# Patient Record
Sex: Male | Born: 1955 | Race: White | Hispanic: No | Marital: Married | Smoking: Never smoker
Health system: Southern US, Community
[De-identification: ages and names within clinical notes are randomized; demographics above are authoritative.]

## PROBLEM LIST (undated history)

## (undated) DIAGNOSIS — G20 Parkinson's disease: Secondary | ICD-10-CM

## (undated) HISTORY — PX: HERNIA REPAIR: SHX51

## (undated) HISTORY — PX: CYST REMOVAL NECK: SHX6281

---

## 2007-05-26 ENCOUNTER — Ambulatory Visit: Payer: Self-pay | Admitting: Internal Medicine

## 2007-08-05 ENCOUNTER — Ambulatory Visit: Payer: Self-pay | Admitting: Gastroenterology

## 2007-08-25 ENCOUNTER — Ambulatory Visit: Payer: Self-pay | Admitting: General Surgery

## 2011-03-08 ENCOUNTER — Encounter: Payer: Self-pay | Admitting: Nurse Practitioner

## 2012-02-03 ENCOUNTER — Ambulatory Visit: Payer: Self-pay | Admitting: Gastroenterology

## 2012-03-31 ENCOUNTER — Ambulatory Visit: Payer: Self-pay | Admitting: Gastroenterology

## 2013-03-02 ENCOUNTER — Ambulatory Visit: Payer: Self-pay | Admitting: Family Medicine

## 2016-10-04 ENCOUNTER — Other Ambulatory Visit: Payer: Self-pay | Admitting: Neurology

## 2016-10-04 DIAGNOSIS — S0990XA Unspecified injury of head, initial encounter: Secondary | ICD-10-CM

## 2016-10-11 ENCOUNTER — Ambulatory Visit
Admission: RE | Admit: 2016-10-11 | Discharge: 2016-10-11 | Disposition: A | Discharge status: Home / Self Care | Payer: 59 | Source: Ambulatory Visit | Attending: Neurology | Admitting: Neurology

## 2016-10-11 DIAGNOSIS — G20 Parkinson's disease: Secondary | ICD-10-CM | POA: Diagnosis present

## 2016-10-11 DIAGNOSIS — I6782 Cerebral ischemia: Secondary | ICD-10-CM | POA: Diagnosis not present

## 2016-10-11 DIAGNOSIS — G319 Degenerative disease of nervous system, unspecified: Secondary | ICD-10-CM | POA: Diagnosis not present

## 2016-10-11 DIAGNOSIS — S0990XA Unspecified injury of head, initial encounter: Secondary | ICD-10-CM

## 2017-10-31 ENCOUNTER — Ambulatory Visit: Admission: RE | Admit: 2017-10-31 | Payer: 59 | Source: Ambulatory Visit | Admitting: Gastroenterology

## 2017-10-31 ENCOUNTER — Encounter: Admission: RE | Payer: Self-pay | Source: Ambulatory Visit

## 2017-10-31 SURGERY — COLONOSCOPY WITH PROPOFOL
Anesthesia: General

## 2018-01-07 ENCOUNTER — Other Ambulatory Visit: Payer: Self-pay | Admitting: Nurse Practitioner

## 2018-01-07 DIAGNOSIS — F99 Mental disorder, not otherwise specified: Secondary | ICD-10-CM

## 2018-01-16 ENCOUNTER — Ambulatory Visit
Admission: RE | Admit: 2018-01-16 | Discharge: 2018-01-16 | Disposition: A | Discharge status: Home / Self Care | Payer: 59 | Source: Ambulatory Visit | Attending: Nurse Practitioner | Admitting: Nurse Practitioner

## 2018-01-16 DIAGNOSIS — F99 Mental disorder, not otherwise specified: Secondary | ICD-10-CM | POA: Insufficient documentation

## 2018-01-16 DIAGNOSIS — I6782 Cerebral ischemia: Secondary | ICD-10-CM | POA: Insufficient documentation

## 2018-01-16 DIAGNOSIS — G319 Degenerative disease of nervous system, unspecified: Secondary | ICD-10-CM | POA: Diagnosis not present

## 2018-01-16 DIAGNOSIS — R296 Repeated falls: Secondary | ICD-10-CM | POA: Insufficient documentation

## 2018-01-16 DIAGNOSIS — R2689 Other abnormalities of gait and mobility: Secondary | ICD-10-CM | POA: Insufficient documentation

## 2018-01-16 LAB — POCT I-STAT CREATININE: CREATININE: 0.9 mg/dL (ref 0.61–1.24)

## 2018-01-16 MED ORDER — GADOBENATE DIMEGLUMINE 529 MG/ML IV SOLN
15.0000 mL | Freq: Once | INTRAVENOUS | Status: AC | PRN
  Administered 2018-01-16: 15 mL via INTRAVENOUS

## 2018-01-27 ENCOUNTER — Emergency Department: Payer: 59

## 2018-01-27 ENCOUNTER — Encounter: Payer: Self-pay | Admitting: Emergency Medicine

## 2018-01-27 ENCOUNTER — Emergency Department
Admission: EM | Admit: 2018-01-27 | Discharge: 2018-01-27 | Disposition: A | Discharge status: Home / Self Care | Payer: 59 | Attending: Emergency Medicine | Admitting: Emergency Medicine

## 2018-01-27 DIAGNOSIS — W01190A Fall on same level from slipping, tripping and stumbling with subsequent striking against furniture, initial encounter: Secondary | ICD-10-CM | POA: Diagnosis not present

## 2018-01-27 DIAGNOSIS — Z23 Encounter for immunization: Secondary | ICD-10-CM | POA: Insufficient documentation

## 2018-01-27 DIAGNOSIS — Y9389 Activity, other specified: Secondary | ICD-10-CM | POA: Diagnosis not present

## 2018-01-27 DIAGNOSIS — Y92009 Unspecified place in unspecified non-institutional (private) residence as the place of occurrence of the external cause: Secondary | ICD-10-CM

## 2018-01-27 DIAGNOSIS — W19XXXA Unspecified fall, initial encounter: Secondary | ICD-10-CM

## 2018-01-27 DIAGNOSIS — Y999 Unspecified external cause status: Secondary | ICD-10-CM | POA: Diagnosis not present

## 2018-01-27 DIAGNOSIS — S0990XA Unspecified injury of head, initial encounter: Secondary | ICD-10-CM | POA: Diagnosis present

## 2018-01-27 DIAGNOSIS — G20 Parkinson's disease: Secondary | ICD-10-CM | POA: Insufficient documentation

## 2018-01-27 DIAGNOSIS — R0781 Pleurodynia: Secondary | ICD-10-CM

## 2018-01-27 DIAGNOSIS — S0181XA Laceration without foreign body of other part of head, initial encounter: Secondary | ICD-10-CM | POA: Diagnosis not present

## 2018-01-27 DIAGNOSIS — Y92010 Kitchen of single-family (private) house as the place of occurrence of the external cause: Secondary | ICD-10-CM | POA: Diagnosis not present

## 2018-01-27 HISTORY — DX: Parkinson's disease: G20

## 2018-01-27 MED ORDER — LIDOCAINE-EPINEPHRINE 2 %-1:100000 IJ SOLN: 30.0000 mL | Freq: Once | INTRAMUSCULAR | Status: DC

## 2018-01-27 MED ORDER — LIDOCAINE-EPINEPHRINE 1 %-1:100000 IJ SOLN
INTRAMUSCULAR | Status: AC
  Administered 2018-01-27: 20 mL
  Filled 2018-01-27: qty 1

## 2018-01-27 MED ORDER — LIDOCAINE-EPINEPHRINE 1 %-1:100000 IJ SOLN
20.0000 mL | Freq: Once | INTRAMUSCULAR | Status: AC
  Administered 2018-01-27: 20 mL
  Filled 2018-01-27: qty 1

## 2018-01-27 MED ORDER — TETANUS-DIPHTH-ACELL PERTUSSIS 5-2.5-18.5 LF-MCG/0.5 IM SUSP
0.5000 mL | Freq: Once | INTRAMUSCULAR | Status: AC
  Administered 2018-01-27: 0.5 mL via INTRAMUSCULAR
  Filled 2018-01-27: qty 0.5

## 2018-01-27 NOTE — ED Triage Notes (Signed)
Patient fell around 8:30am this morning.  Patient has history of parkinsons and lost balance.  Patient's head hit the corner of the kitchen table.  Patient has U shaped laceration to his forehead.  Patient did not pass out after fall.  Patient is also complaining of left sided back pain.

## 2018-01-27 NOTE — ED Notes (Signed)
First Nurse Note:  Patient trying to put on shorts this AM at 0830 and he fell striking head on corner of kitchen table.  Wife denies LOC.  Patient has large laceration over left eye, not actively bleeding at this time.  Brought over from [facility] via WC.

## 2018-01-27 NOTE — ED Provider Notes (Signed)
[facility] Emergency Department Provider Note ____________________________________________  Time seen: 1227  I have reviewed the triage vital signs and the nursing notes.  HISTORY  Chief Complaint  Fall  HPI Brent Bauer is a 62 y.o. male presents to the ED accompanied by his wife, for evaluation of injury sustained following mechanical fall.  Patient with a history of Parkinson's and gait at the mellitus, was in the kitchen at the table, attempting to adjust his short while standing.  He apparently lost his balance, and fell, hitting his forehead on the table.  His wife was in the living room and can see him falling but could not get to him.  She was with him immediately following the fall.  She denies any loss of consciousness, nausea, vomiting, or weakness.  She was able to get the patient to his feet and ultimately to a chair.  She presents him now with bleeding controlled to a large gas to his central forehead.  Patient is also complained about some pain to the left rib cage area.  His only other injury appears to be a small abrasion to his left knee.  She reports he has been of his normal level of activity and cognition since the accident.  Past Medical History:  Diagnosis Date  . Parkinson's disease (HCC)     There are no active problems to display for this patient.  Past Surgical History:  Procedure Laterality Date  . CYST REMOVAL NECK    . HERNIA REPAIR      Prior to Admission medications   Not on File    Allergies Sulfa antibiotics  No family history on file.  Social History Social History   Tobacco Use  . Smoking status: Never Smoker  . Smokeless tobacco: Never Used  Substance Use Topics  . Alcohol use: Not on file  . Drug use: Not on file    Review of Systems  Constitutional: Negative for fever. Eyes: Negative for visual changes. ENT: Negative for sore throat. Cardiovascular: Negative for chest pain. Respiratory: Negative  for shortness of breath. Gastrointestinal: Negative for abdominal pain, vomiting and diarrhea. Musculoskeletal: Negative for back pain. Left rib pain. Skin: Negative for rash. Large forehead laceration and a left knee abrasion Neurological: Negative for headaches, focal weakness or numbness. ____________________________________________  PHYSICAL EXAM:  VITAL SIGNS: ED Triage Vitals  Enc Vitals Group     BP 01/27/18 1110 123/76     Pulse Rate 01/27/18 1110 66     Resp 01/27/18 1110 16     Temp 01/27/18 1110 98.2 F (36.8 C)     Temp Source 01/27/18 1110 Oral     SpO2 01/27/18 1110 100 %     Weight 01/27/18 1111 165 lb (74.8 kg)     Height 01/27/18 1111  (1.778 m)     Head Circumference --      Peak Flow --      Pain Score 01/27/18 1111 8     Pain Loc --      Pain Edu? --      Excl. in GC? --     Constitutional: Alert and oriented. Well appearing and in no distress. Head: Normocephalic and atraumatic, except for a irregular flap to the central forehead.  Patient with a triangular flap with subcu tinea skin exposed.  No active bleeding is noted.  A hematoma has formed in the wound. Eyes: Conjunctivae are normal. Normal extraocular movements Neck: Supple. No thyromegaly.  Motion without crepitus.  No distracting midline tenderness is noted. Cardiovascular: Normal rate, regular rhythm. Normal distal pulses. Respiratory: Normal respiratory effort. No wheezes/rales/rhonchi. Gastrointestinal: Soft and nontender. No distention. Musculoskeletal: No obvious deformity, dislocation, or midline tenderness.  Patient mildly tender palpation to the left rib cage in the mid axillary line.  Nontender with normal range of motion in all extremities.  Neurologic: Baseline gait with ataxia. Normal speech and language. No gross focal neurologic deficits are appreciated. Skin:  Skin is warm, dry and intact. No rash noted.  Patient with a superficial abrasion to the lateral left knee. Psychiatric:  Mood and affect are normal. Patient exhibits appropriate insight and judgment. ____________________________________________   RADIOLOGY  CT Head w/o CM  IMPRESSION: 1.  No acute intracranial abnormality. 2. Frontal scalp soft tissue injury. 3. Age advanced cerebral and cerebellar atrophy.  Left Rib Detail   IMPRESSION: No acute disease.  Negative for fracture. ____________________________________________  PROCEDURES  Tdap 0.5 ml IM  .Marland KitchenLaceration Repair Date/Time: 01/27/2018 3:23 PM Performed by: Lissa Hoard, PA-C Authorized by: Lissa Hoard, PA-C   Consent:    Consent obtained:  Verbal   Consent given by:  Patient   Risks discussed:  Poor cosmetic result and poor wound healing Anesthesia (see MAR for exact dosages):    Anesthesia method:  Local infiltration   Local anesthetic:  Lidocaine 1% WITH epi Laceration details:    Location:  Face   Face location:  Forehead   Length (cm):  5 Repair type:    Repair type:  Intermediate Pre-procedure details:    Preparation:  Patient was prepped and draped in usual sterile fashion Exploration:    Hemostasis achieved with:  Epinephrine   Contaminated: no   Treatment:    Area cleansed with:  Betadine and saline   Amount of cleaning:  Standard   Irrigation solution:  Sterile saline   Irrigation method:  Syringe Subcutaneous repair:    Suture size:  5-0   Suture material:  Fast-absorbing gut   Suture technique:  Running   Number of sutures:  1 Skin repair:    Repair method:  Tissue adhesive Approximation:    Approximation:  Close Post-procedure details:    Dressing:  Open (no dressing)   Patient tolerance of procedure:  Tolerated well, no immediate complications  ____________________________________________  INITIAL IMPRESSION / ASSESSMENT AND PLAN / ED COURSE  Patient with a history of Parkinson's and movement disorder, presents for a large laceration to the forehead following mechanical fall.   Patient is wife reassured by his negative CT and plain film x-rays.  No signs of any acute intracranial process, or acute rib injury.  The patient's wound to the forehead is repaired using subcutaneous sutures and tissue adhesive on the dermal layer.  Good wound repair is achieved and the patient and his wife are pleased with the cosmetic result.  They are advised with wound care instructions and will follow with primary provider return to the ED as necessary. ____________________________________________  FINAL CLINICAL IMPRESSION(S) / ED DIAGNOSES  Final diagnoses:  Fall in home, initial encounter  Forehead laceration, initial encounter  Rib pain      Karmen Stabs, Charlesetta Ivory, PA-C 01/27/18 1531    Sharman Cheek, MD 01/28/18 1520

## 2018-01-27 NOTE — Discharge Instructions (Addendum)
Your exam, x-ray, and CT scan are all essentially normal following your fall at home. Keep the wound on the face clean and dry. Avoid applying ointment, creams, or lotions to the face at the wound. Take OTC Tylenol or Motrin for pain relief. Follow-up with your provider for ongoing symptoms.

## 2018-01-27 NOTE — ED Notes (Signed)
Pt fell this am and hit his forehead on the kitchen table. Pt has large cut across forehead with noted dried blood. Pt denies LOC

## 2018-01-29 ENCOUNTER — Emergency Department
Admission: EM | Admit: 2018-01-29 | Discharge: 2018-01-29 | Disposition: A | Discharge status: Home / Self Care | Payer: 59 | Attending: Emergency Medicine | Admitting: Emergency Medicine

## 2018-01-29 ENCOUNTER — Other Ambulatory Visit: Payer: Self-pay

## 2018-01-29 DIAGNOSIS — F0390 Unspecified dementia without behavioral disturbance: Secondary | ICD-10-CM | POA: Insufficient documentation

## 2018-01-29 DIAGNOSIS — G20 Parkinson's disease: Secondary | ICD-10-CM | POA: Insufficient documentation

## 2018-01-29 DIAGNOSIS — T679XXA Effect of heat and light, unspecified, initial encounter: Secondary | ICD-10-CM | POA: Insufficient documentation

## 2018-01-29 DIAGNOSIS — T7589XA Other specified effects of external causes, initial encounter: Secondary | ICD-10-CM

## 2018-01-29 LAB — COMPREHENSIVE METABOLIC PANEL
ALBUMIN: 4.2 g/dL (ref 3.5–5.0)
ALT: 15 U/L — AB (ref 17–63)
AST: 27 U/L (ref 15–41)
Alkaline Phosphatase: 54 U/L (ref 38–126)
Anion gap: 11 (ref 5–15)
BUN: 25 mg/dL — AB (ref 6–20)
CHLORIDE: 109 mmol/L (ref 101–111)
CO2: 20 mmol/L — AB (ref 22–32)
Calcium: 9.1 mg/dL (ref 8.9–10.3)
Creatinine, Ser: 1.11 mg/dL (ref 0.61–1.24)
GFR calc Af Amer: >60 mL/min (ref >60)
GFR calc non Af Amer: >60 mL/min (ref >60)
GLUCOSE: 104 mg/dL — AB (ref 65–99)
POTASSIUM: 3.9 mmol/L (ref 3.5–5.1)
Sodium: 140 mmol/L (ref 135–145)
Total Bilirubin: 1.6 mg/dL — ABNORMAL HIGH (ref 0.3–1.2)
Total Protein: 7.5 g/dL (ref 6.5–8.1)

## 2018-01-29 LAB — URINALYSIS, ROUTINE W REFLEX MICROSCOPIC
BACTERIA UA: NONE SEEN
Bilirubin Urine: NEGATIVE
GLUCOSE, UA: NEGATIVE mg/dL
Hgb urine dipstick: NEGATIVE
KETONES UR: 5 mg/dL — AB
Leukocytes, UA: NEGATIVE
Nitrite: NEGATIVE
PROTEIN: 30 mg/dL — AB
SQUAMOUS EPITHELIAL / LPF: NONE SEEN (ref 0–5)
Specific Gravity, Urine: 1.026 (ref 1.005–1.030)
pH: 5 (ref 5.0–8.0)

## 2018-01-29 LAB — CBC
HCT: 38.4 % — ABNORMAL LOW (ref 40.0–52.0)
Hemoglobin: 13.2 g/dL (ref 13.0–18.0)
MCH: 32.3 pg (ref 26.0–34.0)
MCHC: 34.4 g/dL (ref 32.0–36.0)
MCV: 94 fL (ref 80.0–100.0)
PLATELETS: 232 10*3/uL (ref 150–440)
RBC: 4.08 MIL/uL — AB (ref 4.40–5.90)
RDW: 12.8 % (ref 11.5–14.5)
WBC: 11.4 10*3/uL — AB (ref 3.8–10.6)

## 2018-01-29 LAB — CK: CK TOTAL: 370 U/L (ref 49–397)

## 2018-01-29 MED ORDER — SODIUM CHLORIDE 0.9 % IV BOLUS
1000.0000 mL | Freq: Once | INTRAVENOUS | Status: AC
  Administered 2018-01-29: 1000 mL via INTRAVENOUS

## 2018-01-29 NOTE — ED Notes (Signed)
Pt took off from home yesterday on a golf cart and was a silver alert in the community. Found by police in the woods and brought by ems to the er. Given 500 cc bolus prior to arrival

## 2018-01-29 NOTE — ED Notes (Signed)
Case mgmt to consult for home health before pt is discharged.

## 2018-01-29 NOTE — ED Notes (Signed)
Saline infusing

## 2018-01-29 NOTE — ED Provider Notes (Signed)
[facility] Emergency Department Provider Note  Time seen: 11:09 AM  I have reviewed the triage vital signs and the nursing notes.   HISTORY  Chief Complaint Heat Exposure    HPI Brent Bauer is a 62 y.o. male with a past medical history of Parkinson's disease, dementia, presents to the emergency department after being found out side overnight.  According to EMS the patient was reported missing yesterday, family saw him get on his golf cart and leave the home.  Patient currently lives at home with family.  They were unable to locate the patient yesterday, they found the golf cart, there was a search underway and the patient was finally located today.  They state they found the patient located in thick [place] with many scrapes and scratches across his body.  Was found near water but was not wet.  Reassuringly the temperatures were fairly warm last night.  Upon arrival patient is awake alert, able to tell me he is in the hospital but believes that this 2010.  Patient has no complaints at all at this time.  Past Medical History:  Diagnosis Date  . Parkinson's disease (HCC)     There are no active problems to display for this patient.   Past Surgical History:  Procedure Laterality Date  . CYST REMOVAL NECK    . HERNIA REPAIR      Prior to Admission medications   Not on File    Allergies  Allergen Reactions  . Sulfa Antibiotics Other (See Comments)    No family history on file.  Social History Social History   Tobacco Use  . Smoking status: Never Smoker  . Smokeless tobacco: Never Used  Substance Use Topics  . Alcohol use: Not on file  . Drug use: Not on file    Review of Systems Unable to obtain an accurate review of systems secondary to dementia at baseline.  ____________________________________________   PHYSICAL EXAM:  Constitutional: Alert, no distress, overall well-appearing. Eyes: Normal exam ENT   Head: Normocephalic  and atraumatic.   Mouth/Throat: Mildly dry mucous membranes. Cardiovascular: Normal rate, regular rhythm.  Respiratory: Normal respiratory effort without tachypnea nor retractions. Breath sounds are clear Gastrointestinal: Soft and nontender. No distention.   Musculoskeletal: Nontender with normal range of motion in all extremities. Neurologic:  Normal speech and language. No gross focal neurologic deficits Skin: Patient has several scrapes across his distal upper and lower extremities.  All hemostatic. Psychiatric: Mood and affect are normal.   ____________________________________________    EKG  EKG reviewed and interpreted by myself shows sinus tachycardia 108 bpm with a narrow QRS, what appears to be normal axis with largely normal intervals.  Nonspecific ST changes.  No obvious ST elevation although significant electrical interference.  ____________________________________________    INITIAL IMPRESSION / ASSESSMENT AND PLAN / ED COURSE  Pertinent labs & imaging results that were available during my care of the patient were reviewed by me and considered in my medical decision making (see chart for details).  Patient presents to the emergency department after being found outside.  Patient was outside yesterday afternoon when he was extremely hot, remained outside throughout the night and into this morning.  Patient has baseline dementia and Parkinson's.  However given the patient's environmental exposure we will check labs, IV hydrate and closely monitor.  Patient does have several scrapes across his extremities likely due to walking through thick [place].  Patient has no complaints is calm and cooperative.  Patient's labs  are largely within normal limits.  Patient appears very well.  He has received IV fluids.  I discussed the work-up with the family, they wish to take the patient home.  They did want to explore possible options for home health care or a nurse aide to help at home.  I  have placed a case management consult, which I understand is now done remotely.  We will discharge the patient home with the family.  ____________________________________________   FINAL CLINICAL IMPRESSION(S) / ED DIAGNOSES  Environmental exposure    Minna Antis, MD 01/29/18 1252

## 2018-01-29 NOTE — Discharge Instructions (Addendum)
Please follow-up with your primary care doctor for recheck/reevaluation.  A case manager should be calling you at home to help with further resources.

## 2018-01-29 NOTE — ED Triage Notes (Signed)
Pt is a missing person with hx of alzheimers and dementia in the woods. Multiple scratches and hematoma to forehead

## 2018-07-03 DEATH — deceased

## 2019-01-06 IMAGING — CT CT HEAD W/O CM
3 series · 16 of 47 positions shown, 19 images · non-contrast
Comparison: Brain MR 01/16/2018.  Head CT 10/11/2016

CLINICAL DATA: trauma to forehead.

EXAM:
CT HEAD WITHOUT CONTRAST
TECHNIQUE: Contiguous axial images were obtained from the base of the skull
through the vertex without intravenous contrast.

[Series 3: head wo · axial · 0.46mm/px · z∈[-111,+24]mm · 10 of 33 slices shown, 13 images]
[im 3/33  brain]
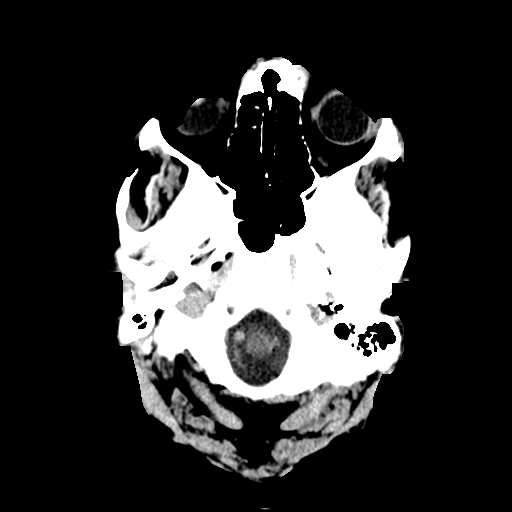
[im 3/33  bone]
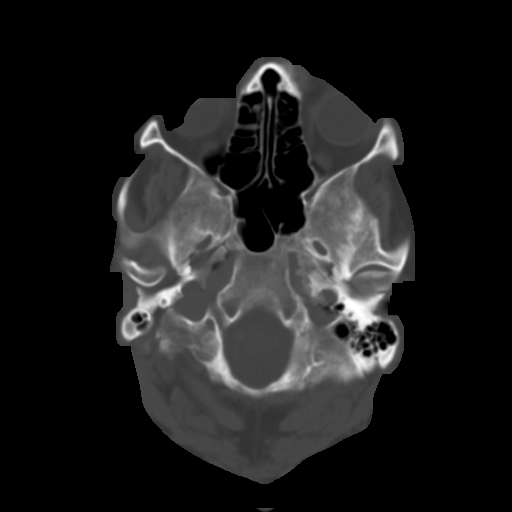
[im 6/33  brain]
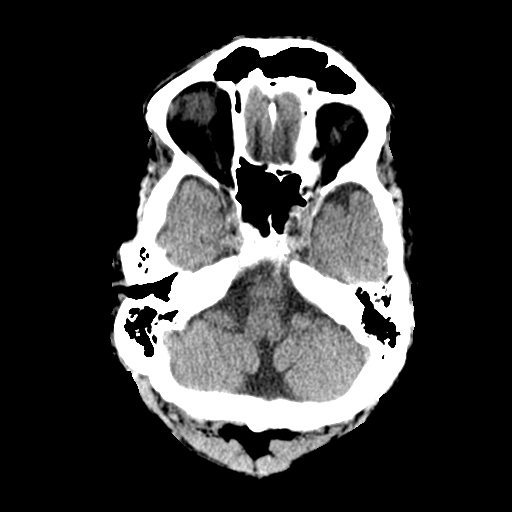
[im 9/33  brain]
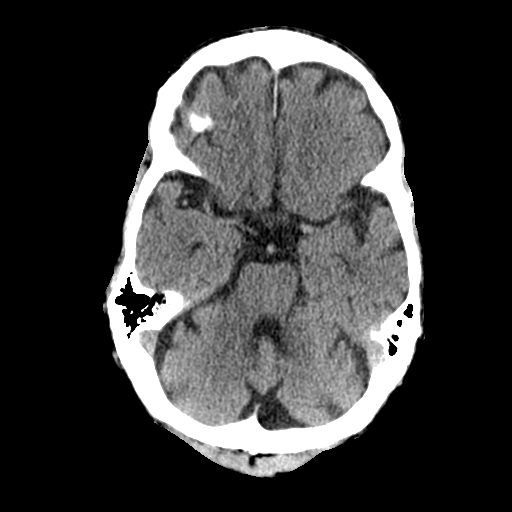
[im 12/33  brain]
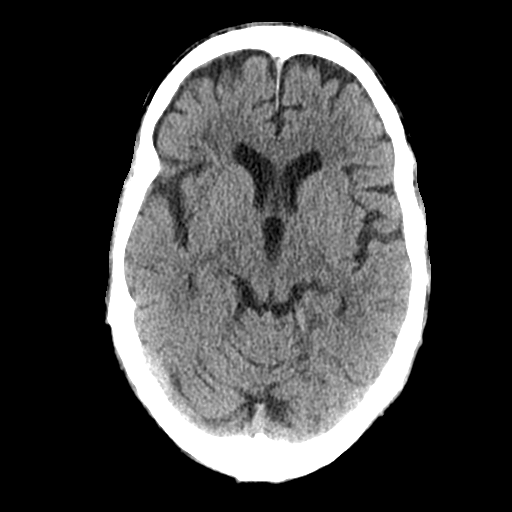
[im 15/33  brain]
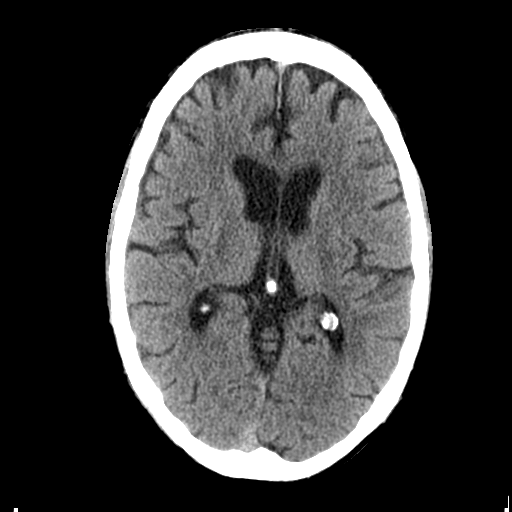
[im 15/33  bone]
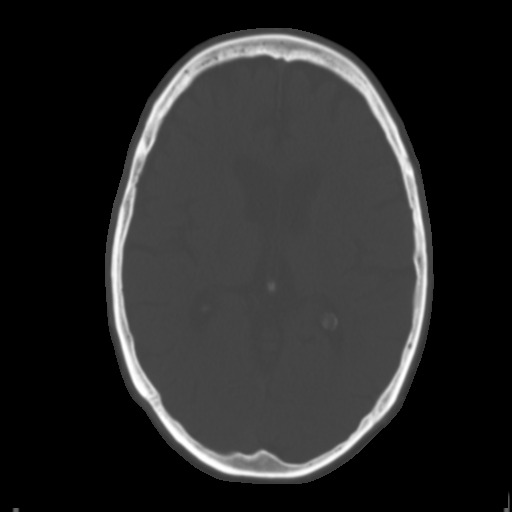
[im 18/33  brain]
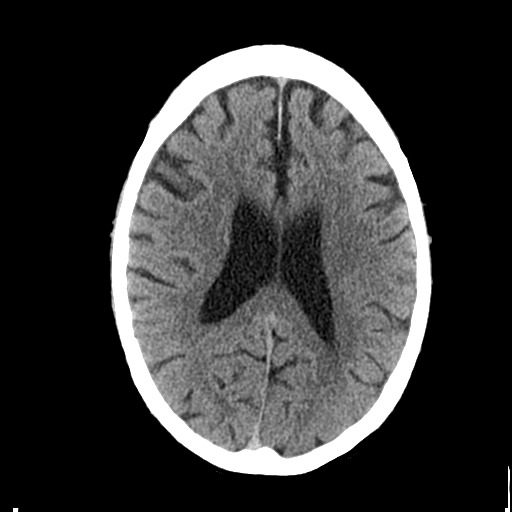
[im 21/33  brain]
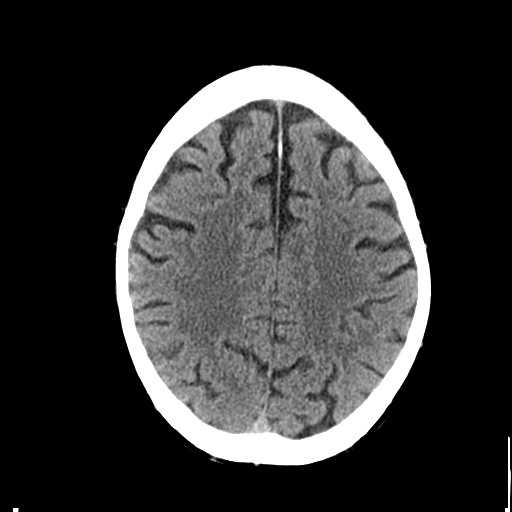
[im 25/33  brain]
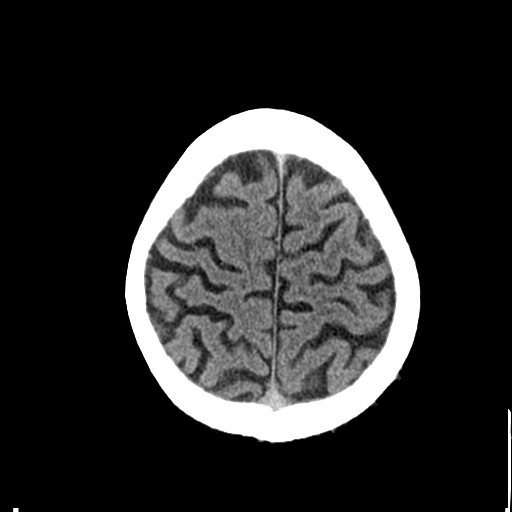
[im 27/33  brain]
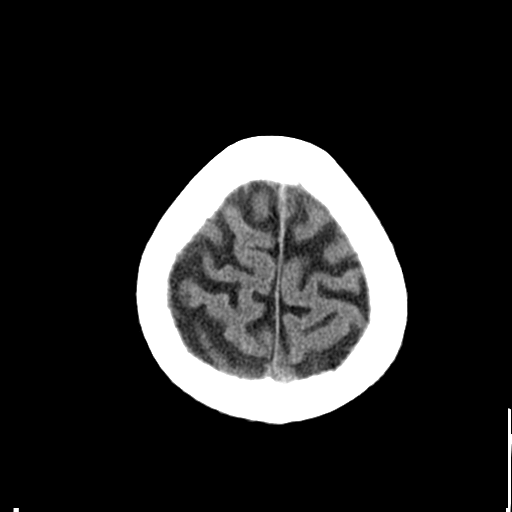
[im 27/33  bone]
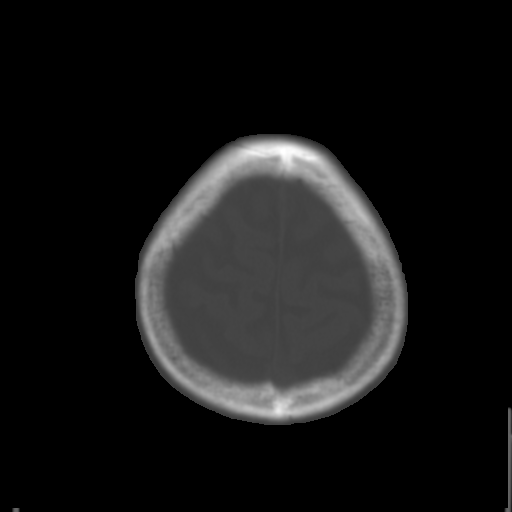
[im 30/33  brain]
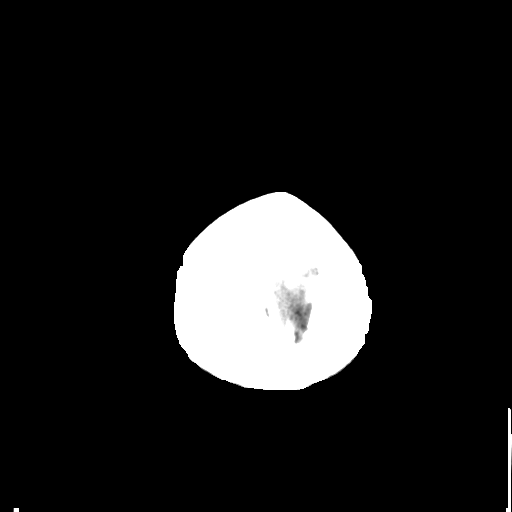

[Series 4: coronal soft tissue · coronal · 0.33mm/px · 3 of 69 slices shown]
[im 23/69  brain]
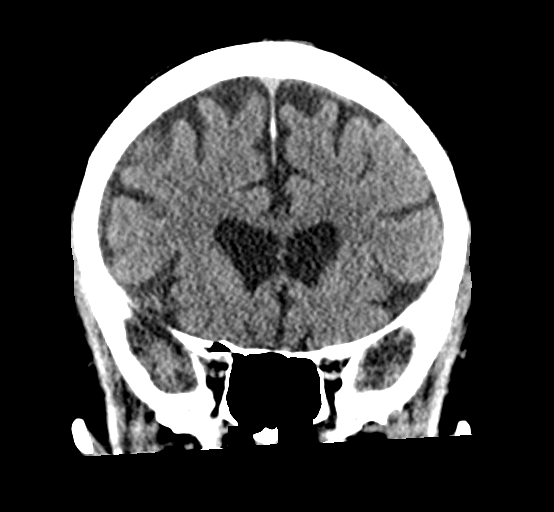
[im 31/69  brain]
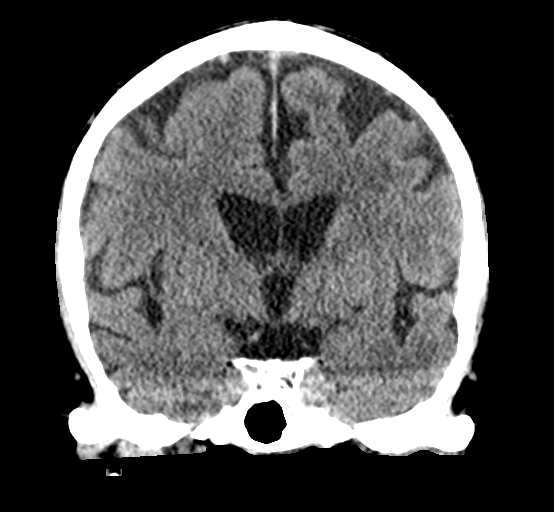
[im 38/69  brain]
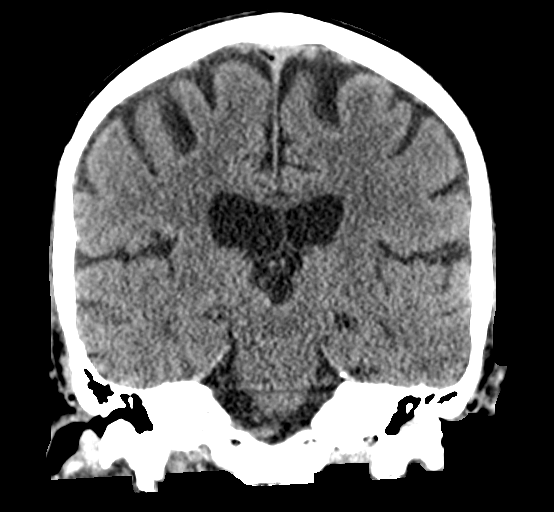

[Series 5: sagittal soft tissue · sagittal · 0.35mm/px · 3 of 55 slices shown]
[im 19/55  brain]
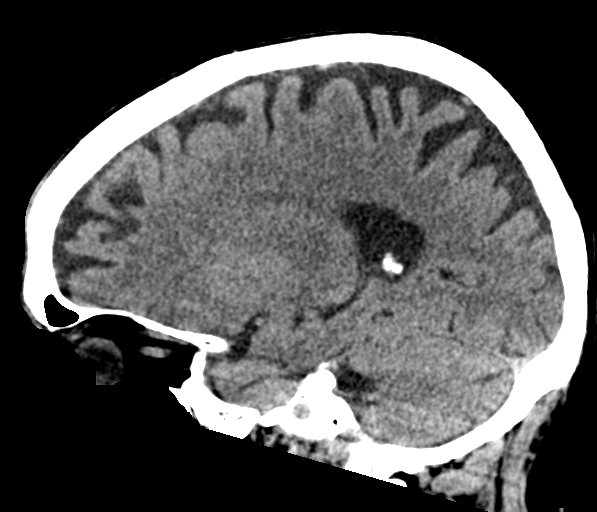
[im 28/55  brain]
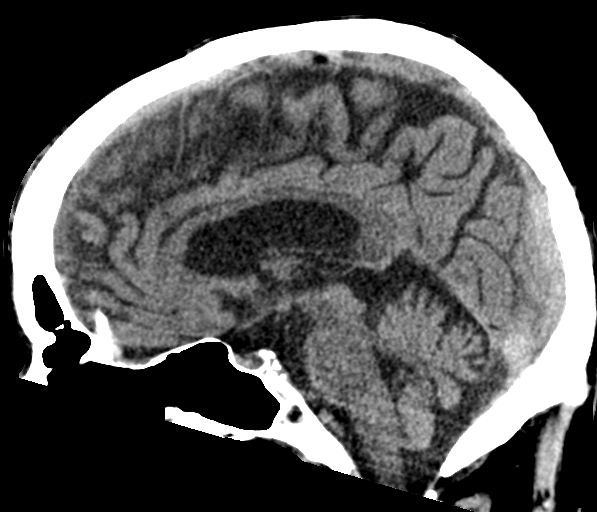
[im 37/55  brain]
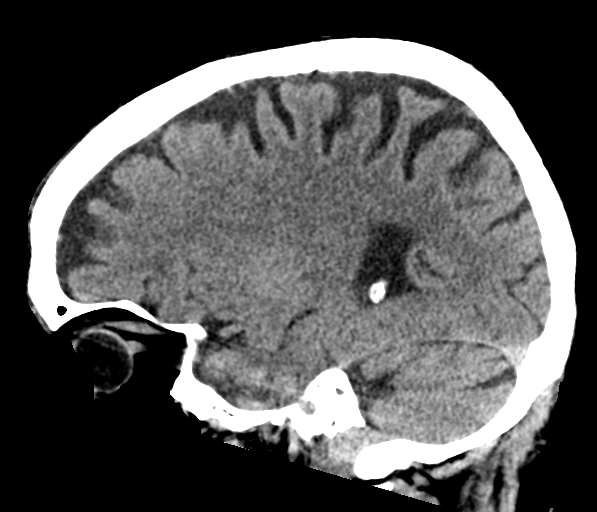

[16 of 47 positions shown; findings below may reference images not displayed]

FINDINGS: Brain: Cerebral atrophy which is mildly age advanced. There is also
a significantly age advanced cerebellar atrophy. No mass lesion,
hemorrhage, hydrocephalus, acute infarct, intra-axial, or
extra-axial fluid collection.

Vascular: Intracranial atherosclerosis.

Skull: Soft tissue injury about the left paramidline frontal scalp.
No skull fracture.

Sinuses/Orbits: Normal imaged portions of the orbits and globes.
Clear paranasal sinuses and mastoid air cells. Cerumen in the
external ear canals bilaterally.

Other: None.
IMPRESSION: 1.  No acute intracranial abnormality.
2. Frontal scalp soft tissue injury.
3. Age advanced cerebral and cerebellar atrophy.
